# Patient Record
Sex: Female | Born: 1985 | Race: Black or African American | Hispanic: No | Marital: Single | Smoking: Never smoker
Health system: Southern US, Community
[De-identification: ages and names within clinical notes are randomized; demographics above are authoritative.]

## PROBLEM LIST (undated history)

## (undated) DIAGNOSIS — Z87442 Personal history of urinary calculi: Secondary | ICD-10-CM

## (undated) HISTORY — PX: CHOLECYSTECTOMY: SHX55

---
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
## 2019-05-02 ENCOUNTER — Observation Stay (HOSPITAL_COMMUNITY)
Admission: EM | Admit: 2019-05-02 | Discharge: 2019-05-03 | Disposition: A | Discharge status: Home / Self Care | Payer: Medicaid Other | Attending: General Surgery | Admitting: General Surgery

## 2019-05-02 ENCOUNTER — Emergency Department (HOSPITAL_COMMUNITY): Payer: Medicaid Other

## 2019-05-02 ENCOUNTER — Encounter (HOSPITAL_COMMUNITY): Payer: Self-pay

## 2019-05-02 DIAGNOSIS — S0003XA Contusion of scalp, initial encounter: Secondary | ICD-10-CM

## 2019-05-02 DIAGNOSIS — S12500A Unspecified displaced fracture of sixth cervical vertebra, initial encounter for closed fracture: Secondary | ICD-10-CM | POA: Diagnosis not present

## 2019-05-02 DIAGNOSIS — S060X0A Concussion without loss of consciousness, initial encounter: Secondary | ICD-10-CM | POA: Diagnosis present

## 2019-05-02 DIAGNOSIS — Z20828 Contact with and (suspected) exposure to other viral communicable diseases: Secondary | ICD-10-CM | POA: Diagnosis not present

## 2019-05-02 DIAGNOSIS — S00212A Abrasion of left eyelid and periocular area, initial encounter: Secondary | ICD-10-CM | POA: Insufficient documentation

## 2019-05-02 DIAGNOSIS — S0081XA Abrasion of other part of head, initial encounter: Secondary | ICD-10-CM | POA: Insufficient documentation

## 2019-05-02 DIAGNOSIS — M25561 Pain in right knee: Secondary | ICD-10-CM | POA: Diagnosis not present

## 2019-05-02 DIAGNOSIS — S0083XA Contusion of other part of head, initial encounter: Secondary | ICD-10-CM | POA: Insufficient documentation

## 2019-05-02 DIAGNOSIS — E876 Hypokalemia: Secondary | ICD-10-CM | POA: Insufficient documentation

## 2019-05-02 DIAGNOSIS — S060XAA Concussion with loss of consciousness status unknown, initial encounter: Secondary | ICD-10-CM

## 2019-05-02 DIAGNOSIS — S060X9A Concussion with loss of consciousness of unspecified duration, initial encounter: Secondary | ICD-10-CM

## 2019-05-02 DIAGNOSIS — R791 Abnormal coagulation profile: Secondary | ICD-10-CM | POA: Insufficient documentation

## 2019-05-02 DIAGNOSIS — M25562 Pain in left knee: Secondary | ICD-10-CM | POA: Diagnosis not present

## 2019-05-02 DIAGNOSIS — S12591A Other nondisplaced fracture of sixth cervical vertebra, initial encounter for closed fracture: Secondary | ICD-10-CM

## 2019-05-02 HISTORY — DX: Concussion with loss of consciousness status unknown, initial encounter: S06.0XAA

## 2019-05-02 HISTORY — DX: Personal history of urinary calculi: Z87.442

## 2019-05-02 HISTORY — DX: Concussion with loss of consciousness of unspecified duration, initial encounter: S06.0X9A

## 2019-05-02 LAB — CBC
HCT: 42.7 % (ref 36.0–46.0)
Hemoglobin: 13.2 g/dL (ref 12.0–15.0)
MCH: 25.2 pg — ABNORMAL LOW (ref 26.0–34.0)
MCHC: 30.9 g/dL (ref 30.0–36.0)
MCV: 81.6 fL (ref 80.0–100.0)
Platelets: 255 10*3/uL (ref 150–400)
RBC: 5.23 MIL/uL — ABNORMAL HIGH (ref 3.87–5.11)
RDW: 15.7 % — ABNORMAL HIGH (ref 11.5–15.5)
WBC: 13.2 10*3/uL — ABNORMAL HIGH (ref 4.0–10.5)
nRBC: 0.3 % — ABNORMAL HIGH (ref 0.0–0.2)

## 2019-05-02 LAB — RAPID URINE DRUG SCREEN, HOSP PERFORMED
Amphetamines: NOT DETECTED
Barbiturates: NOT DETECTED
Benzodiazepines: NOT DETECTED
Cocaine: NOT DETECTED
Opiates: NOT DETECTED
Tetrahydrocannabinol: NOT DETECTED

## 2019-05-02 LAB — I-STAT CHEM 8, ED
BUN: 12 mg/dL (ref 6–20)
Calcium, Ion: 1.04 mmol/L — ABNORMAL LOW (ref 1.15–1.40)
Chloride: 108 mmol/L (ref 98–111)
Creatinine, Ser: 0.6 mg/dL (ref 0.44–1.00)
Glucose, Bld: 138 mg/dL — ABNORMAL HIGH (ref 70–99)
HCT: 41 % (ref 36.0–46.0)
Hemoglobin: 13.9 g/dL (ref 12.0–15.0)
Potassium: 3.1 mmol/L — ABNORMAL LOW (ref 3.5–5.1)
Sodium: 141 mmol/L (ref 135–145)
TCO2: 19 mmol/L — ABNORMAL LOW (ref 22–32)

## 2019-05-02 LAB — URINALYSIS, ROUTINE W REFLEX MICROSCOPIC
Bilirubin Urine: NEGATIVE
Glucose, UA: NEGATIVE mg/dL
Ketones, ur: 5 mg/dL — AB
Nitrite: POSITIVE — AB
Protein, ur: NEGATIVE mg/dL
Specific Gravity, Urine: >1.046 — ABNORMAL HIGH (ref 1.005–1.030)
pH: 5 (ref 5.0–8.0)

## 2019-05-02 LAB — PROTIME-INR
INR: 2.1 — ABNORMAL HIGH (ref 0.8–1.2)
INR: 1.1 (ref 0.8–1.2)
Prothrombin Time: 23.8 seconds — ABNORMAL HIGH (ref 11.4–15.2)
Prothrombin Time: 14.3 seconds (ref 11.4–15.2)

## 2019-05-02 LAB — CDS SEROLOGY

## 2019-05-02 LAB — COMPREHENSIVE METABOLIC PANEL
ALT: 20 U/L (ref 0–44)
AST: 25 U/L (ref 15–41)
Albumin: 3.7 g/dL (ref 3.5–5.0)
Alkaline Phosphatase: 82 U/L (ref 38–126)
Anion gap: 12 (ref 5–15)
BUN: 12 mg/dL (ref 6–20)
CO2: 20 mmol/L — ABNORMAL LOW (ref 22–32)
Calcium: 8.9 mg/dL (ref 8.9–10.3)
Chloride: 108 mmol/L (ref 98–111)
Creatinine, Ser: 0.79 mg/dL (ref 0.44–1.00)
GFR calc Af Amer: >60 mL/min (ref >60)
GFR calc non Af Amer: >60 mL/min (ref >60)
Glucose, Bld: 154 mg/dL — ABNORMAL HIGH (ref 70–99)
Potassium: 3.1 mmol/L — ABNORMAL LOW (ref 3.5–5.1)
Sodium: 140 mmol/L (ref 135–145)
Total Bilirubin: 1 mg/dL (ref 0.3–1.2)
Total Protein: 6.8 g/dL (ref 6.5–8.1)

## 2019-05-02 LAB — HIV ANTIBODY (ROUTINE TESTING W REFLEX): HIV Screen 4th Generation wRfx: NONREACTIVE

## 2019-05-02 LAB — ETHANOL: Alcohol, Ethyl (B): <10 mg/dL (ref <10)

## 2019-05-02 LAB — SARS CORONAVIRUS 2 (TAT 6-24 HRS): SARS Coronavirus 2: NEGATIVE

## 2019-05-02 LAB — APTT: aPTT: 29 seconds (ref 24–36)

## 2019-05-02 LAB — LACTIC ACID, PLASMA
Lactic Acid, Venous: 4.5 mmol/L (ref 0.5–1.9)
Lactic Acid, Venous: 1.2 mmol/L (ref 0.5–1.9)

## 2019-05-02 LAB — I-STAT BETA HCG BLOOD, ED (MC, WL, AP ONLY): I-stat hCG, quantitative: <5 m[IU]/mL (ref <5)

## 2019-05-02 MED ORDER — CALCIUM GLUCONATE-NACL 2-0.675 GM/100ML-% IV SOLN
2.0000 g | Freq: Once | INTRAVENOUS | Status: AC
  Administered 2019-05-02: 2000 mg via INTRAVENOUS
  Filled 2019-05-02: qty 100

## 2019-05-02 MED ORDER — PANTOPRAZOLE SODIUM 40 MG IV SOLR: 40.0000 mg | Freq: Every day | INTRAVENOUS | Status: DC

## 2019-05-02 MED ORDER — ONDANSETRON HCL 4 MG/2ML IJ SOLN
4.0000 mg | Freq: Once | INTRAMUSCULAR | Status: AC
  Administered 2019-05-02: 4 mg via INTRAVENOUS
  Filled 2019-05-02: qty 2

## 2019-05-02 MED ORDER — SODIUM CHLORIDE 0.9 % IV BOLUS
1000.0000 mL | Freq: Once | INTRAVENOUS | Status: AC
  Administered 2019-05-02: 1000 mL via INTRAVENOUS

## 2019-05-02 MED ORDER — METHOCARBAMOL 500 MG PO TABS
1000.0000 mg | ORAL_TABLET | Freq: Three times a day (TID) | ORAL | Status: DC
  Administered 2019-05-02 – 2019-05-03 (×2): 1000 mg via ORAL
  Filled 2019-05-02 (×2): qty 2

## 2019-05-02 MED ORDER — SODIUM CHLORIDE 0.9 % IV SOLN
INTRAVENOUS | Status: DC
  Administered 2019-05-02: 15:00:00 via INTRAVENOUS

## 2019-05-02 MED ORDER — DOCUSATE SODIUM 100 MG PO CAPS
100.0000 mg | ORAL_CAPSULE | Freq: Two times a day (BID) | ORAL | Status: DC
  Administered 2019-05-02 – 2019-05-03 (×2): 100 mg via ORAL
  Filled 2019-05-02 (×2): qty 1

## 2019-05-02 MED ORDER — MORPHINE SULFATE (PF) 2 MG/ML IV SOLN: 2.0000 mg | INTRAVENOUS | Status: DC | PRN

## 2019-05-02 MED ORDER — IOHEXOL 300 MG/ML  SOLN
100.0000 mL | Freq: Once | INTRAMUSCULAR | Status: AC | PRN
  Administered 2019-05-02: 100 mL via INTRAVENOUS

## 2019-05-02 MED ORDER — LORAZEPAM 2 MG/ML IJ SOLN
0.5000 mg | Freq: Once | INTRAMUSCULAR | Status: AC
Start: 2019-05-02 — End: 2019-05-02
  Administered 2019-05-02: 0.5 mg via INTRAVENOUS

## 2019-05-02 MED ORDER — LACTATED RINGERS IV BOLUS
1000.0000 mL | Freq: Once | INTRAVENOUS | Status: AC
  Administered 2019-05-02: 1000 mL via INTRAVENOUS

## 2019-05-02 MED ORDER — METOPROLOL TARTRATE 5 MG/5ML IV SOLN: 5.0000 mg | Freq: Four times a day (QID) | INTRAVENOUS | Status: DC | PRN

## 2019-05-02 MED ORDER — PANTOPRAZOLE SODIUM 40 MG PO TBEC
40.0000 mg | DELAYED_RELEASE_TABLET | Freq: Every day | ORAL | Status: DC
  Administered 2019-05-02: 40 mg via ORAL
  Filled 2019-05-02: qty 1

## 2019-05-02 MED ORDER — POTASSIUM CHLORIDE CRYS ER 20 MEQ PO TBCR
40.0000 meq | EXTENDED_RELEASE_TABLET | Freq: Two times a day (BID) | ORAL | Status: AC
  Administered 2019-05-02 (×2): 40 meq via ORAL
  Filled 2019-05-02 (×2): qty 2

## 2019-05-02 MED ORDER — LORAZEPAM 2 MG/ML IJ SOLN
INTRAMUSCULAR | Status: AC
  Filled 2019-05-02: qty 1

## 2019-05-02 MED ORDER — TETANUS-DIPHTH-ACELL PERTUSSIS 5-2.5-18.5 LF-MCG/0.5 IM SUSP
0.5000 mL | Freq: Once | INTRAMUSCULAR | Status: AC
  Administered 2019-05-02: 0.5 mL via INTRAMUSCULAR
  Filled 2019-05-02: qty 0.5

## 2019-05-02 MED ORDER — OXYCODONE HCL 5 MG PO TABS: 5.0000 mg | ORAL_TABLET | ORAL | Status: DC | PRN

## 2019-05-02 MED ORDER — ONDANSETRON 4 MG PO TBDP: 4.0000 mg | ORAL_TABLET | Freq: Four times a day (QID) | ORAL | Status: DC | PRN

## 2019-05-02 MED ORDER — PROMETHAZINE HCL 25 MG/ML IJ SOLN
12.5000 mg | Freq: Four times a day (QID) | INTRAMUSCULAR | Status: DC | PRN
  Administered 2019-05-02: 12.5 mg via INTRAVENOUS
  Filled 2019-05-02: qty 1

## 2019-05-02 MED ORDER — FENTANYL CITRATE (PF) 100 MCG/2ML IJ SOLN
50.0000 ug | Freq: Once | INTRAMUSCULAR | Status: AC
  Administered 2019-05-02: 50 ug via INTRAVENOUS
  Filled 2019-05-02: qty 2

## 2019-05-02 MED ORDER — ACETAMINOPHEN 325 MG PO TABS
650.0000 mg | ORAL_TABLET | ORAL | Status: DC | PRN
  Administered 2019-05-03: 650 mg via ORAL
  Filled 2019-05-02: qty 2

## 2019-05-02 MED ORDER — FLUORESCEIN SODIUM 1 MG OP STRP
1.0000 | ORAL_STRIP | Freq: Once | OPHTHALMIC | Status: AC
  Administered 2019-05-02: 1 via OPHTHALMIC
  Filled 2019-05-02: qty 1

## 2019-05-02 MED ORDER — ONDANSETRON HCL 4 MG/2ML IJ SOLN
4.0000 mg | Freq: Four times a day (QID) | INTRAMUSCULAR | Status: DC | PRN
  Administered 2019-05-02 – 2019-05-03 (×2): 4 mg via INTRAVENOUS
  Filled 2019-05-02 (×2): qty 2

## 2019-05-02 MED ORDER — LORAZEPAM 2 MG/ML IJ SOLN: 0.5000 mg | Freq: Once | INTRAMUSCULAR | Status: DC

## 2019-05-02 MED ORDER — ERYTHROMYCIN 5 MG/GM OP OINT
1.0000 "application " | TOPICAL_OINTMENT | Freq: Once | OPHTHALMIC | Status: AC
  Administered 2019-05-02: 1 via OPHTHALMIC
  Filled 2019-05-02: qty 3.5

## 2019-05-02 MED ORDER — TETRACAINE HCL 0.5 % OP SOLN
2.0000 [drp] | Freq: Once | OPHTHALMIC | Status: AC
  Administered 2019-05-02: 2 [drp] via OPHTHALMIC
  Filled 2019-05-02: qty 4

## 2019-05-02 MED ORDER — LACTATED RINGERS IV SOLN
INTRAVENOUS | Status: DC
  Administered 2019-05-02 – 2019-05-03 (×2): via INTRAVENOUS
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
## 2019-05-02 NOTE — Progress Notes (Signed)
Orthopedic Tech Progress Note Patient Details:  Angel Hines 01-07-1986 [id number]  Ortho Devices Type of Ortho Device: Soft collar Ortho Device/Splint Interventions: Application   Post Interventions Patient Tolerated: Well Instructions Provided: Care of device   [place] Pink 05/02/2019, 6:54 PM

## 2019-05-02 NOTE — ED Notes (Signed)
Patient transported to X-ray 
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
## 2019-05-02 NOTE — ED Notes (Signed)
Pt ambulatory to and from restroom with steady gait 

## 2019-05-02 NOTE — ED Notes (Signed)
Patient transported to CT 
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
## 2019-05-02 NOTE — Plan of Care (Signed)
  Problem: Education: Goal: Knowledge of General Education information will improve Description Including pain rating scale, medication(s)/side effects and non-pharmacologic comfort measures Outcome: Progressing   

## 2019-05-03 ENCOUNTER — Encounter (HOSPITAL_COMMUNITY): Payer: Self-pay | Admitting: General Practice

## 2019-05-03 ENCOUNTER — Other Ambulatory Visit: Payer: Self-pay

## 2019-05-03 LAB — COMPREHENSIVE METABOLIC PANEL WITH GFR
ALT: 15 U/L (ref 0–44)
AST: 20 U/L (ref 15–41)
Albumin: 3.1 g/dL — ABNORMAL LOW (ref 3.5–5.0)
Alkaline Phosphatase: 60 U/L (ref 38–126)
Anion gap: 6 (ref 5–15)
BUN: <5 mg/dL — ABNORMAL LOW (ref 6–20)
CO2: 24 mmol/L (ref 22–32)
Calcium: 8.7 mg/dL — ABNORMAL LOW (ref 8.9–10.3)
Chloride: 108 mmol/L (ref 98–111)
Creatinine, Ser: 0.75 mg/dL (ref 0.44–1.00)
GFR calc Af Amer: >60 mL/min
GFR calc non Af Amer: >60 mL/min
Glucose, Bld: 108 mg/dL — ABNORMAL HIGH (ref 70–99)
Potassium: 3.7 mmol/L (ref 3.5–5.1)
Sodium: 138 mmol/L (ref 135–145)
Total Bilirubin: 1.2 mg/dL (ref 0.3–1.2)
Total Protein: 5.9 g/dL — ABNORMAL LOW (ref 6.5–8.1)

## 2019-05-03 LAB — CBC
HCT: 35.4 % — ABNORMAL LOW (ref 36.0–46.0)
Hemoglobin: 11.2 g/dL — ABNORMAL LOW (ref 12.0–15.0)
MCH: 25.1 pg — ABNORMAL LOW (ref 26.0–34.0)
MCHC: 31.6 g/dL (ref 30.0–36.0)
MCV: 79.2 fL — ABNORMAL LOW (ref 80.0–100.0)
Platelets: 204 K/uL (ref 150–400)
RBC: 4.47 MIL/uL (ref 3.87–5.11)
RDW: 15.8 % — ABNORMAL HIGH (ref 11.5–15.5)
WBC: 11.3 K/uL — ABNORMAL HIGH (ref 4.0–10.5)
nRBC: 0 % (ref 0.0–0.2)

## 2019-05-03 LAB — SAMPLE TO BLOOD BANK

## 2019-05-03 LAB — MAGNESIUM: Magnesium: 1.6 mg/dL — ABNORMAL LOW (ref 1.7–2.4)

## 2019-05-03 LAB — PHOSPHORUS: Phosphorus: 2.9 mg/dL (ref 2.5–4.6)

## 2019-05-03 MED ORDER — DOCUSATE SODIUM 100 MG PO CAPS: 100.0000 mg | ORAL_CAPSULE | Freq: Two times a day (BID) | ORAL | 0 refills | Status: AC | PRN

## 2019-05-03 MED ORDER — ENOXAPARIN SODIUM 40 MG/0.4ML ~~LOC~~ SOLN
40.0000 mg | SUBCUTANEOUS | Status: DC
  Administered 2019-05-03: 40 mg via SUBCUTANEOUS
  Filled 2019-05-03: qty 0.4

## 2019-05-03 MED ORDER — METHOCARBAMOL 500 MG PO TABS: 500.0000 mg | ORAL_TABLET | Freq: Four times a day (QID) | ORAL | 0 refills | Status: AC | PRN

## 2019-05-03 MED ORDER — OXYCODONE HCL 5 MG PO TABS: 5.0000 mg | ORAL_TABLET | Freq: Four times a day (QID) | ORAL | 0 refills | Status: AC | PRN

## 2019-05-03 MED ORDER — ACETAMINOPHEN 325 MG PO TABS: 650.0000 mg | ORAL_TABLET | Freq: Four times a day (QID) | ORAL | Status: AC | PRN

## 2019-05-03 MED ORDER — ONDANSETRON 4 MG PO TBDP: 4.0000 mg | ORAL_TABLET | Freq: Four times a day (QID) | ORAL | 0 refills | Status: AC | PRN
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
## 2019-05-03 NOTE — Discharge Instructions (Signed)
Concussion, Adult  A concussion is a brain injury from a hard, direct hit (trauma) to the head or body. This direct hit causes the brain to shake quickly back and forth inside the skull. This can damage brain cells and cause chemical changes in the brain. A concussion may also be known as a mild traumatic brain injury (TBI). Concussions are usually not life-threatening, but the effects of a concussion can be serious. If you have a concussion, you should be very careful to avoid having a second concussion. What are the causes? This condition is caused by:  A direct hit to your head, such as: ? Running into another player during a game. ? Being hit in a fight. ? Hitting your head on a hard surface.  Sudden movement of your body that causes your brain to move back and forth inside the skull, such as in a car crash. What are the signs or symptoms? The signs of a concussion can be hard to notice. Early on, they may be missed by you, family members, and health care providers. You may look fine on the outside but may act or feel differently. Symptoms are usually temporary and most often improve in 7-10 days. Some symptoms appear right away, but other symptoms may not show up for hours or days. If your symptoms last longer than normal, you may have post-concussion syndrome. Every head injury is different. Physical symptoms  Headaches. This can include a feeling of pressure in the head or migraine-like symptoms.  Tiredness (fatigue).  Dizziness.  Problems with coordination or balance.  Vision or hearing problems.  Sensitivity to light or noise.  Nausea or vomiting.  Changes in eating or sleeping patterns.  Numbness or tingling.  Seizure. Mental and emotional symptoms  Memory problems.  Trouble concentrating, organizing, or making decisions.  Slowness in thinking, acting or reacting, speaking, or reading.  Irritability or mood changes.  Anxiety or depression. How is this  diagnosed? This condition is diagnosed based on:  Your symptoms.  A description of your injury. You may also have tests, including:  Imaging tests, such as a CT scan or MRI.  Neuropsychological tests. These measure your thinking, understanding, learning, and remembering abilities. How is this treated? Treatment for this condition includes:  Stopping sports or activity if you are injured. If you hit your head or show signs of concussion: ? Do not return to sports or activities the same day. ? Get checked by a health care provider before you return to your activities.  Physical and mental rest and careful observation, usually at home. Gradually return to your normal activities.  Medicines to help with symptoms such as headaches, nausea, or difficulty sleeping. ? Avoid taking opioid pain medicine while recovering from a concussion.  Avoiding alcohol and drugs. These may slow your recovery and can put you at risk of further injury.  Referral to a concussion clinic or rehabilitation center. Recovery from a concussion can take time. How fast you recover depends on many factors. Return to activities only when:  Your symptoms are completely gone.  Your health care provider says that it is safe. Follow these instructions at home: Activity  Limit activities that require a lot of thought or concentration, such as: ? Doing homework or job-related work. ? Watching TV. ? Working on the computer or phone. ? Playing memory games and puzzles.  Rest. Rest helps your brain heal. Make sure you: ? Get plenty of sleep. Most adults should get 7-9 hours of sleep  each night. ? Rest during the day. Take naps or rest breaks when you feel tired.  Avoid physical activity like exercise until your health care provider says it is safe. Stop any activity that worsens symptoms.  Do not do high-risk activities that could cause a second concussion, such as riding a bike or playing sports.  Ask your  health care provider when you can return to your normal activities, such as school, work, athletics, and driving. Your ability to react may be slower after a brain injury. Never do these activities if you are dizzy. Your health care provider will likely give you a plan for gradually returning to activities. General instructions   Take over-the-counter and prescription medicines only as told by your health care provider. Some medicines, such as blood thinners (anticoagulants) and aspirin, may increase the risk for complications, such as bleeding.  Do not drink alcohol until your health care provider says you can.  Watch your symptoms and tell others around you to do the same. Complications sometimes occur after a concussion. Older adults with a brain injury may have a higher risk of serious complications.  Tell your work Freight forwarder, teachers, Government social research officer, school counselor, coach, or Product/process development scientist about your injury, symptoms, and restrictions.  Keep all follow-up visits as told by your health care provider. This is important. How is this prevented? Avoiding another brain injury is very important. In rare cases, another injury can lead to permanent brain damage, brain swelling, or death. The risk of this is greatest during the first 7-10 days after a head injury. Avoid injuries by:  Stopping activities that could lead to a second concussion, such as contact or recreational sports, until your health care provider says it is okay.  Taking these actions once you have returned to sports or activities: ? Avoiding plays or moves that can cause you to crash into another person. This is how most concussions occur. ? Following the rules and being respectful of other players. Do not engage in violent or illegal plays.  Getting regular exercise that includes strength and balance training.  Wearing a properly fitting helmet during sports, biking, or other activities. Helmets can help protect you from  serious skull and brain injuries, but they do not protect you from a concussion. Even when wearing a helmet, you should avoid being hit in the head. Contact a health care provider if:  Your symptoms get worse or they do not improve.  You have new symptoms.  You have another injury. Get help right away if:  You have severe or worsening headaches.  You have weakness or numbness in any part of your body.  You are confused.  Your coordination gets worse.  You vomit repeatedly.  You are sleepier than normal.  Your speech is slurred.  You cannot recognize people or places.  You have a seizure.  It is difficult to wake you up.  You have unusual behavior changes.  You have changes in your vision.  You lose consciousness. Summary  A concussion is a brain injury that results from a hard, direct hit (trauma) to your head or body.  You may have imaging tests and neuropsychological tests to diagnose a concussion.  Treatment for this condition includes physical and mental rest and careful observation.  Ask your health care provider when you can return to your normal activities, such as school, work, athletics, and driving.  Get help right away if you have a severe headache, weakness on one side of the  body, seizures, behavior changes, changes in vision, or if you are confused or sleepier than normal. This information is not intended to replace advice given to you by your health care provider. Make sure you discuss any questions you have with your health care provider. Document Released: 08/15/2003 Document Revised: 01/13/2018 Document Reviewed: 01/13/2018 Elsevier Patient Education  2020 [employer].   Hematoma A hematoma is a collection of blood under the skin, in an organ, in a body space, in a joint space, or in other tissue. The blood can thicken (clot) to form a lump that you can see and feel. The lump is often firm and may become sore and tender. Most hematomas get  better in a few days to weeks. However, some hematomas may be serious and require medical care. Hematomas can range from very small to very large. What are the causes? This condition is caused by:  A blunt or penetrating injury.  A leakage from a blood vessel under the skin.  Some medical procedures, including surgeries, such as oral surgery, face lifts, and surgeries on the joints.  Some medical conditions that cause bleeding or bruising. There may be multiple hematomas that appear in different areas of the body. What increases the risk? You are more likely to develop this condition if:  You are an older adult.  You use blood thinners. What are the signs or symptoms?  Symptoms of this condition depend on where the hematoma is located.  Common symptoms of a hematoma that is under the skin include:  A firm lump on the body.  Pain and tenderness in the area.  Bruising. Blue, dark blue, purple-red, or yellowish skin (discoloration) may appear at the site of the hematoma if the hematoma is close to the surface of the skin. Common symptoms of a hematoma that is deep in the tissues or body spaces may be less obvious. They include:  A collection of blood in the stomach (intra-abdominal hematoma). This may cause pain in the abdomen, weakness, fainting, and shortness of breath.  A collection of blood in the head (intracranial hematoma). This may cause a headache or symptoms such as weakness, trouble speaking or understanding, or a change in consciousness. How is this diagnosed? This condition is diagnosed based on:  Your medical history.  A physical exam.  Imaging tests, such as an ultrasound or CT scan. These may be needed if your health care provider suspects a hematoma in deeper tissues or body spaces.  Blood tests. These may be needed if your health care provider believes that the hematoma is caused by a medical condition. How is this treated? Treatment for this condition  depends on the cause, size, and location of the hematoma. Treatment may include:  Doing nothing. The majority of hematomas do not need treatment as many of them go away on their own over time.  Surgery or close monitoring. This may be needed for large hematomas or hematomas that affect vital organs.  Medicines. Medicines may be given if there is an underlying medical cause for the hematoma. Follow these instructions at home: Managing pain, stiffness, and swelling   If directed, put ice on the affected area. ? Put ice in a plastic bag. ? Place a towel between your skin and the bag. ? Leave the ice on for 20 minutes, 2-3 times a day for the first couple of days.  If directed, apply heat to the affected area after applying ice for a couple of days. Use the heat source  that your health care provider recommends, such as a moist heat pack or a heating pad. ? Place a towel between your skin and the heat source. ? Leave the heat on for 20-30 minutes. ? Remove the heat if your skin turns bright red. This is especially important if you are unable to feel pain, heat, or cold. You may have a greater risk of getting burned.  Raise (elevate) the affected area above the level of your heart while you are sitting or lying down.  If told, wrap the affected area with an elastic bandage. The bandage applies pressure (compression) to the area, which may help to reduce swelling and promote healing. Do not wrap the bandage too tightly around the affected area.  If your hematoma is on a leg or foot (lower extremity) and is painful, your health care provider may recommend crutches. Use them as told by your health care provider. General instructions  Take over-the-counter and prescription medicines only as told by your health care provider.  Keep all follow-up visits as told by your health care provider. This is important. Contact a health care provider if:  You have a fever.  The swelling or discoloration  gets worse.  You develop more hematomas. Get help right away if:  Your pain is worse or your pain is not controlled with medicine.  Your skin over the hematoma breaks or starts bleeding.  Your hematoma is in your chest or abdomen and you have weakness, shortness of breath, or a change in consciousness.  You have a hematoma on your scalp that is caused by a fall or injury, and you also have: ? A headache that gets worse. ? Trouble speaking or understanding speech. ? Weakness. ? Change in alertness or consciousness. Summary  A hematoma is a collection of blood under the skin, in an organ, in a body space, in a joint space, or in other tissue.  This condition usually does not need treatment because many hematomas go away on their own over time.  Large hematomas, or those that may affect vital organs, may need surgical drainage or monitoring. If the hematoma is caused by a medical condition, medicines may be prescribed.  Get help right away if your hematoma breaks or starts to bleed, you have shortness of breath, or you have a headache or trouble speaking after a fall. This information is not intended to replace advice given to you by your health care provider. Make sure you discuss any questions you have with your health care provider. Document Released: 01/07/2004 Document Revised: 10/28/2017 Document Reviewed: 10/28/2017 Elsevier Patient Education  2020 [employer]     Managing Your Pain After Surgery Without Opioids    Thank you for participating in our program to help patients manage their pain after surgery without opioids. This is part of our effort to provide you with the best care possible, without exposing you or your family to the risk that opioids pose.  What pain can I expect after surgery? You can expect to have some pain after surgery. This is normal. The pain is typically worse the day after surgery, and quickly begins to get better. Many studies have  found that many patients are able to manage their pain after surgery with Over-the-Counter (OTC) medications such as Tylenol and Motrin. If you have a condition that does not allow you to take Tylenol or Motrin, notify your surgical team.  How will I manage my pain? The best strategy for controlling your pain after  surgery is around the clock pain control with Tylenol (acetaminophen) and Motrin (ibuprofen or Advil). Alternating these medications with each other allows you to maximize your pain control. In addition to Tylenol and Motrin, you can use heating pads or ice packs on your incisions to help reduce your pain.  How will I alternate your regular strength over-the-counter pain medication? You will take a dose of pain medication every three hours. ; Start by taking 650 mg of Tylenol (2 pills of 325 mg) ; 3 hours later take 600 mg of Motrin (3 pills of 200 mg) ; 3 hours after taking the Motrin take 650 mg of Tylenol ; 3 hours after that take 600 mg of Motrin.   - 1 -  See example - if your first dose of Tylenol is at 12:00 PM   12:00 PM Tylenol 650 mg (2 pills of 325 mg)  3:00 PM Motrin 600 mg (3 pills of 200 mg)  6:00 PM Tylenol 650 mg (2 pills of 325 mg)  9:00 PM Motrin 600 mg (3 pills of 200 mg)  Continue alternating every 3 hours   We recommend that you follow this schedule around-the-clock for at least 3 days after surgery, or until you feel that it is no longer needed. Use the table on the last page of this handout to keep track of the medications you are taking. Important: Do not take more than  of Tylenol or  of Motrin in a 24-hour period. Do not take ibuprofen/Motrin if you have a history of bleeding stomach ulcers, severe kidney disease, &/or actively taking a blood thinner  What if I still have pain? If you have pain that is not controlled with the over-the-counter pain medications (Tylenol and Motrin or Advil) you might have what we call breakthrough pain.  You will receive a prescription for a small amount of an opioid pain medication such as Oxycodone, Tramadol, or Tylenol with Codeine. Use these opioid pills in the first 24 hours after surgery if you have breakthrough pain. Do not take more than 1 pill every 4-6 hours.  If you still have uncontrolled pain after using all opioid pills, don't hesitate to call our staff using the number provided. We will help make sure you are managing your pain in the best way possible, and if necessary, we can provide a prescription for additional pain medication.   Day 1    Time  Name of Medication Number of pills taken  Amount of Acetaminophen  Pain Level   Comments  AM PM       AM PM       AM PM       AM PM       AM PM       AM PM       AM PM       AM PM       Total Daily amount of Acetaminophen Do not take more than  3,000 mg per day      Day 2    Time  Name of Medication Number of pills taken  Amount of Acetaminophen  Pain Level   Comments  AM PM       AM PM       AM PM       AM PM       AM PM       AM PM       AM PM       AM  PM       Total Daily amount of Acetaminophen Do not take more than  3,000 mg per day      Day 3    Time  Name of Medication Number of pills taken  Amount of Acetaminophen  Pain Level   Comments  AM PM       AM PM       AM PM       AM PM          AM PM       AM PM       AM PM       AM PM       Total Daily amount of Acetaminophen Do not take more than  3,000 mg per day      Day 4    Time  Name of Medication Number of pills taken  Amount of Acetaminophen  Pain Level   Comments  AM PM       AM PM       AM PM       AM PM       AM PM       AM PM       AM PM       AM PM       Total Daily amount of Acetaminophen Do not take more than  3,000 mg per day      Day 5    Time  Name of Medication Number of pills taken  Amount of Acetaminophen  Pain Level   Comments  AM PM       AM PM       AM PM       AM PM       AM PM        AM PM       AM PM       AM PM       Total Daily amount of Acetaminophen Do not take more than  3,000 mg per day       Day 6    Time  Name of Medication Number of pills taken  Amount of Acetaminophen  Pain Level  Comments  AM PM       AM PM       AM PM       AM PM       AM PM       AM PM       AM PM       AM PM       Total Daily amount of Acetaminophen Do not take more than  3,000 mg per day      Day 7    Time  Name of Medication Number of pills taken  Amount of Acetaminophen  Pain Level   Comments  AM PM       AM PM       AM PM       AM PM       AM PM       AM PM       AM PM       AM PM       Total Daily amount of Acetaminophen Do not take more than  3,000 mg per day        For additional information about how and where to safely dispose of unused opioid medications - [email or web address]  Disclaimer: This document contains information  and/or instructional materials adapted from [facility] for the typical patient with your condition. It does not replace medical advice from your health care provider because your experience may differ from that of the typical patient. Talk to your health care provider if you have any questions about this document, your condition or your treatment plan. Adapted from [facility]
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
## 2019-05-03 NOTE — Progress Notes (Signed)
At 1600 Discharge instructions reviewed with pt and family by RN Krisit SWOT RN .  Copy of instructions given to pt and informed of scripts being sent to her pharmacy. Pt to call to front desk when dressed and ready to go.   At 1700 Pt d/c'd via wheelchair with belongings, with family.             Escorted by unit NT.

## 2019-05-03 NOTE — Progress Notes (Signed)
Physical Therapy Discharge Patient Details Name: Angel Hines MRN: [id number] DOB: Oct 12, 1985 Today's Date: 05/03/2019 Time: 8921-1941 PT Time Calculation (min) (ACUTE ONLY): 22 min  Patient discharged from PT services secondary to goals met and no further PT needs identified.  Please see latest therapy progress note for current level of functioning and progress toward goals.    Progress and discharge plan discussed with patient and/or caregiver: Patient/Caregiver agrees with plan  GP    Silvana Newness, SPT   Silvana Newness 05/03/2019, 10:23 AM
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
## 2020-02-28 ENCOUNTER — Ambulatory Visit
Admission: EM | Admit: 2020-02-28 | Discharge: 2020-02-28 | Disposition: A | Discharge status: Home / Self Care | Payer: Medicaid Other | Attending: Emergency Medicine | Admitting: Emergency Medicine

## 2020-02-28 ENCOUNTER — Other Ambulatory Visit: Payer: Self-pay

## 2020-02-28 ENCOUNTER — Encounter: Payer: Self-pay | Admitting: Emergency Medicine

## 2020-02-28 DIAGNOSIS — M79643 Pain in unspecified hand: Secondary | ICD-10-CM | POA: Diagnosis not present

## 2020-02-28 DIAGNOSIS — G5603 Carpal tunnel syndrome, bilateral upper limbs: Secondary | ICD-10-CM

## 2020-02-28 DIAGNOSIS — M25539 Pain in unspecified wrist: Secondary | ICD-10-CM

## 2020-02-28 DIAGNOSIS — G8929 Other chronic pain: Secondary | ICD-10-CM | POA: Diagnosis not present

## 2020-02-28 NOTE — ED Triage Notes (Signed)
Pain and burning to bilateral hands x 1 1/2 years.  States she works with her hands a lot.  Pt is 5 months preg and pain has gotten worse since she has been pregnant
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
## 2020-02-28 NOTE — Discharge Instructions (Signed)
Symptoms sound consistent with carpal tunnel syndrome, most likely exacerbated by pregnancy Continue conservative management of rest, ice, and gentle stretches Take tylenol as needed for pain Wrist braces given Follow up with orthopedist as needed Return or go to the ER if you have any new or worsening symptoms (fever, chills, chest pain, redness, deformity, bruising, etc...)
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
## 2023-06-17 ENCOUNTER — Other Ambulatory Visit: Payer: Self-pay | Admitting: Internal Medicine

## 2023-06-17 DIAGNOSIS — K7400 Hepatic fibrosis, unspecified: Secondary | ICD-10-CM

## 2023-06-25 ENCOUNTER — Ambulatory Visit
Admission: RE | Admit: 2023-06-25 | Discharge: 2023-06-25 | Disposition: A | Discharge status: Home / Self Care | Payer: 59 | Source: Ambulatory Visit | Attending: Internal Medicine | Admitting: Internal Medicine

## 2023-06-25 DIAGNOSIS — K7400 Hepatic fibrosis, unspecified: Secondary | ICD-10-CM

## 2023-07-02 ENCOUNTER — Other Ambulatory Visit: Payer: 59

## 2023-07-02 ENCOUNTER — Ambulatory Visit
Admission: RE | Admit: 2023-07-02 | Discharge: 2023-07-02 | Disposition: A | Discharge status: Home / Self Care | Payer: 59 | Source: Ambulatory Visit | Attending: Internal Medicine | Admitting: Internal Medicine
